# Patient Record
Sex: Female | Born: 2000 | Race: Black or African American | Hispanic: No | Marital: Single | Smoking: Never smoker
Health system: Southern US, Community
[De-identification: ages and names within clinical notes are randomized; demographics above are authoritative.]

## PROBLEM LIST (undated history)

## (undated) DIAGNOSIS — Q72899 Other reduction defects of unspecified lower limb: Secondary | ICD-10-CM

## (undated) DIAGNOSIS — J45909 Unspecified asthma, uncomplicated: Secondary | ICD-10-CM

## (undated) DIAGNOSIS — T8859XA Other complications of anesthesia, initial encounter: Secondary | ICD-10-CM

## (undated) DIAGNOSIS — T4145XA Adverse effect of unspecified anesthetic, initial encounter: Secondary | ICD-10-CM

---
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
## 2019-12-08 ENCOUNTER — Emergency Department (HOSPITAL_BASED_OUTPATIENT_CLINIC_OR_DEPARTMENT_OTHER): Payer: Worker's Compensation

## 2019-12-08 ENCOUNTER — Emergency Department (HOSPITAL_BASED_OUTPATIENT_CLINIC_OR_DEPARTMENT_OTHER)
Admission: EM | Admit: 2019-12-08 | Discharge: 2019-12-08 | Disposition: A | Discharge status: Home / Self Care | Payer: Self-pay | Attending: Emergency Medicine | Admitting: Emergency Medicine

## 2019-12-08 ENCOUNTER — Encounter (HOSPITAL_BASED_OUTPATIENT_CLINIC_OR_DEPARTMENT_OTHER): Payer: Self-pay | Admitting: *Deleted

## 2019-12-08 ENCOUNTER — Other Ambulatory Visit: Payer: Self-pay

## 2019-12-08 DIAGNOSIS — J45909 Unspecified asthma, uncomplicated: Secondary | ICD-10-CM | POA: Insufficient documentation

## 2019-12-08 DIAGNOSIS — S63501A Unspecified sprain of right wrist, initial encounter: Secondary | ICD-10-CM | POA: Insufficient documentation

## 2019-12-08 DIAGNOSIS — X58XXXA Exposure to other specified factors, initial encounter: Secondary | ICD-10-CM | POA: Insufficient documentation

## 2019-12-08 DIAGNOSIS — Y939 Activity, unspecified: Secondary | ICD-10-CM | POA: Insufficient documentation

## 2019-12-08 DIAGNOSIS — Y999 Unspecified external cause status: Secondary | ICD-10-CM | POA: Insufficient documentation

## 2019-12-08 DIAGNOSIS — M25531 Pain in right wrist: Secondary | ICD-10-CM

## 2019-12-08 DIAGNOSIS — Y929 Unspecified place or not applicable: Secondary | ICD-10-CM | POA: Insufficient documentation

## 2019-12-08 DIAGNOSIS — Z7951 Long term (current) use of inhaled steroids: Secondary | ICD-10-CM | POA: Insufficient documentation

## 2019-12-08 NOTE — Discharge Instructions (Signed)
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  Fortunately, there is no evidence of fracture or dislocation.  No evidence of infection on exam.  I suspect this is more of an overuse irritation and inflammation.  Please use the wrist brace, ice, elevation, and follow-up with sports medicine for further management.  Please rest and stay hydrated.  If any symptoms change or worsen acutely, please return to the nearest emergency department.

## 2019-12-08 NOTE — ED Provider Notes (Signed)
No evidence of [facility] EMERGENCY DEPARTMENT Provider Note   CSN: [id number] Arrival date & time: 12/08/19  1901     History Chief Complaint  Patient presents with  . Wrist Pain    Gina Cain is a 19 y.o. female.  The history is provided by the patient and medical records. No language interpreter was used.  Wrist Pain The current episode started more than 2 days ago. The problem occurs constantly. The problem has not changed since onset.Pertinent negatives include no chest pain, no abdominal pain, no headaches and no shortness of breath. Nothing aggravates the symptoms. Nothing relieves the symptoms. She has tried nothing for the symptoms. The treatment provided no relief.       Past Medical History:  Diagnosis Date  . Asthma   . 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 Complication of anesthesia    states woke up during surgery 12/2012    Patient Active Problem List   Diagnosis Date Noted  . Acute pain of left knee 09/29/2017    Past Surgical History:  Procedure Laterality Date  . GRAFT APPLICATION Left 11/18/2015   Procedure: PLACEMENT OF BONE GRAFT AND K WIRES FOURTH METATARSAL;  Surgeon: [name], [name];  Location: [facility];  Service: Podiatry;  Laterality: Left;  . HARDWARE REMOVAL Left 11/18/2015   Procedure: REMOVAL OF FIXATION DEEP K WIRE/SCREW FOURTH METATARSAL LEFT FOOT;  Surgeon: [name], [name];  Location: [facility];  Service: Podiatry;  Laterality: Left;  . METATARSAL OSTEOTOMY Right 01/02/2013   Procedure: FOURTH RIGHT METATARSAL OSTEOTOMY/APPLICATION OF EXTERNAL FIXATOR/SINGLE PLAIN WITH PINS;  Surgeon: Alvan Dame, [name];  Location: [facility];  Service: Podiatry;  Laterality: Right;  . METATARSAL OSTEOTOMY Left 08/12/2015   Procedure: METATARSAL OSTEOTOMY WITH EXTERNAL FIXATOR 4TH LEFT TOE;  Surgeon: [name], [name];  Location: [facility];  Service: Podiatry;   Laterality: Left;     OB History   No obstetric history on file.     Family History  Problem Relation Age of Onset  . Diabetes Mother   . Asthma Brother   . Hypertension Maternal Grandmother   . Hypertension Maternal Grandfather     Social History   Tobacco Use  . Smoking status: Never Smoker  . Smokeless tobacco: Never Used  Vaping Use  . Vaping Use: Never used  Substance Use Topics  . Alcohol use: No  . Drug use: No    Home Medications Prior to Admission medications   Medication Sig Start Date End Date Taking? Authorizing Provider  ALBUTEROL SULFATE IN Inhale into the lungs.    [provider]  naproxen (NAPROSYN) 500 MG tablet Take 1 tablet (500 mg total) by mouth 2 (two) times daily. 11/30/18   Petrucelli, Samantha R, [name]    Allergies    Sulfa antibiotics  Review of Systems   Review of Systems  Constitutional: Negative for chills, diaphoresis, fatigue and fever.  HENT: Negative for congestion.   Respiratory: Negative for cough, chest tightness, shortness of breath and wheezing.   Cardiovascular: Negative for chest pain and palpitations.  Gastrointestinal: Negative for abdominal pain, constipation, diarrhea, nausea and vomiting.  Genitourinary: Negative for flank pain.  Musculoskeletal: Negative for back pain and neck pain.  Skin: Negative for rash and wound.  Neurological: Negative for light-headedness and headaches.  Psychiatric/Behavioral: Negative for agitation.  All other systems reviewed and are negative.   Physical Exam Updated Vital Signs BP 120/77 (BP Location:  Right Arm)   Pulse 61   Temp 98.9 F (37.2 C) (Oral)   Resp 18   Ht 5\' 5"  (1.651 m)   Wt 74.8 kg   LMP 11/07/2019   SpO2 100%   BMI 27.46 kg/m   Physical Exam Vitals and nursing note reviewed.  Constitutional:      General: She is not in acute distress.    Appearance: She is well-developed. She is not ill-appearing, toxic-appearing or diaphoretic.  HENT:     Head:  Normocephalic and atraumatic.     Nose: No congestion or rhinorrhea.     Mouth/Throat:     Mouth: Mucous membranes are moist.  Eyes:     Extraocular Movements: Extraocular movements intact.     Conjunctiva/sclera: Conjunctivae normal.     Pupils: Pupils are equal, round, and reactive to light.  Cardiovascular:     Rate and Rhythm: Normal rate and regular rhythm.     Pulses: Normal pulses.     Heart sounds: No murmur heard.   Pulmonary:     Effort: Pulmonary effort is normal. No respiratory distress.     Breath sounds: Normal breath sounds. No wheezing, rhonchi or rales.  Chest:     Chest wall: No tenderness.  Abdominal:     General: Abdomen is flat.     Palpations: Abdomen is soft.     Tenderness: There is no abdominal tenderness. There is no right CVA tenderness, left CVA tenderness, guarding or rebound.  Musculoskeletal:        General: Tenderness present.     Cervical back: Neck supple.     Right lower leg: No edema.     Left lower leg: No edema.     Comments: Normal pulse, grip strength, sensation in right hand.  Good range of motion.  Normal pulses.  No laceration.  Tenderness present in wrist.  No snuffbox tenderness.  Exam otherwise unremarkable.  Skin:    General: Skin is warm and dry.     Capillary Refill: Capillary refill takes less than 2 seconds.     Coloration: Skin is not pale.     Findings: No erythema, lesion or rash.  Neurological:     General: No focal deficit present.     Mental Status: She is alert.  Psychiatric:        Mood and Affect: Mood normal.     ED Results / Procedures / Treatments   Labs (all labs ordered are listed, but only abnormal results are displayed) Labs Reviewed - No data to display  EKG None  Radiology DG Wrist Complete Right  Result Date: 12/08/2019 CLINICAL DATA:  19 year old female with right wrist pain. EXAM: RIGHT WRIST - COMPLETE 3+ VIEW COMPARISON:  None. FINDINGS: There is no evidence of fracture or dislocation. There  is no evidence of arthropathy or other focal bone abnormality. Soft tissues are unremarkable. IMPRESSION: Negative. Electronically Signed   By: Anner Crete M.D.   On: 12/08/2019 19:59    Procedures Procedures (including critical care time)  Medications Ordered in ED Medications - No data to display  ED Course  I have reviewed the triage vital signs and the nursing notes.  Pertinent labs & imaging results that were available during my care of the patient were reviewed by me and considered in my medical decision making (see chart for details).    MDM Rules/Calculators/A&P  Gina Cain is a 19 y.o. female with no significant past medical history presents with right wrist pain.  Patient reports that she had wrist pain on and off for the last few months and seems to be worsened when she is using a drill at work.  She reports she sometimes gets it twisted and it hurts initially after this maneuver.  She says that the pain is moderate.  She is ambidextrous and uses both of her hands.  She reports no constant numbness or tingling but reports occasional tingling when the pain is severe.  She denies history of carpal tunnel or wrist injuries in the past.  No prior fractures.  She denies any elbow or shoulder pains.  No other complaint specifically no chest pain, shortness breath, nausea, vomiting, urinary symptoms or GI symptoms.  No fevers or chills.  No skin changes or redness.  She denies any crush injuries.  On exam, patient has some tenderness in her right wrist but has normal wrist extension and flexion.  Normal grip strength and sensation.  No laceration.  No crepitance.  Erythema.  No evidence of infection.  No tenderness in the elbow or shoulder.  Lungs clear and chest nontender.  Abdomen nontender.  Good pulses.  Patient had x-ray showing no fracture dislocation.  Suspect overuse causing wrist sprain and wrist pain.  Given lack of trauma, low suspicion for  occult fracture or scaphoid injury.  Low suspicion for infection based on exam and history.  Was given a wrist brace and will follow up with sports medicine for further management.  She will use over-the-counter anti-inflammatory medication.  She has no other questions or concerns and was discharged in good condition.    Final Clinical Impression(s) / ED Diagnoses Final diagnoses:  Sprain of right wrist, initial encounter  Right wrist pain    Rx / DC Orders ED Discharge Orders    None     Clinical Impression: 1. Sprain of right wrist, initial encounter   2. Right wrist pain     Disposition: Discharge  Condition: Good  I have discussed the results, Dx and Tx plan with the pt(& family if present). He/she/they expressed understanding and agree(s) with the plan. Discharge instructions discussed at great length. Strict return precautions discussed and pt &/or family have verbalized understanding of the instructions. No further questions at time of discharge.    Discharge Medication List as of 12/08/2019  8:44 PM      Follow Up: Myra Rude, MD [address] [place] [address] [phone]   with sports medicine  [facility] [address] [id number] [place] [address] [phone]       Zanayah Shadowens, Canary Brim, MD 12/09/19 0110

## 2019-12-08 NOTE — ED Triage Notes (Signed)
Right wrist pain for a week. 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

## 2020-08-05 ENCOUNTER — Emergency Department (HOSPITAL_BASED_OUTPATIENT_CLINIC_OR_DEPARTMENT_OTHER): Payer: Worker's Compensation

## 2020-08-05 ENCOUNTER — Emergency Department (HOSPITAL_BASED_OUTPATIENT_CLINIC_OR_DEPARTMENT_OTHER)
Admission: EM | Admit: 2020-08-05 | Discharge: 2020-08-05 | Disposition: A | Discharge status: Home / Self Care | Payer: Worker's Compensation | Attending: Emergency Medicine | Admitting: Emergency Medicine

## 2020-08-05 ENCOUNTER — Other Ambulatory Visit: Payer: Self-pay

## 2020-08-05 ENCOUNTER — Encounter (HOSPITAL_BASED_OUTPATIENT_CLINIC_OR_DEPARTMENT_OTHER): Payer: Self-pay

## 2020-08-05 DIAGNOSIS — M25531 Pain in right wrist: Secondary | ICD-10-CM | POA: Diagnosis present

## 2020-08-05 DIAGNOSIS — J45909 Unspecified asthma, uncomplicated: Secondary | ICD-10-CM | POA: Insufficient documentation

## 2020-08-05 NOTE — ED Triage Notes (Signed)
Pt states she was lifting "bread" at work when her right wrist popped. No swelling noted, states hurts with movement.

## 2020-08-05 NOTE — Discharge Instructions (Addendum)
Your work-up today was reassuring.  Please read the attachment on tendinitis.  Your symptoms may be reflective of overuse injury given your job.  X-rays were obtained and without any obvious explanation or injury.  No fractures or dislocation.  I would like you to continue with rest, ice, elevation, and wearing a brace for support.  Please avoid overuse to see if your symptoms improve with rest.  Continue with ibuprofen 600 mg every 6 hours as needed for pain and inflammation.  Please get established with a primary care provider for ongoing evaluation and management.  I have also referred you to Dr. Izora Ribas, hand surgeon, for ongoing evaluation and management should her symptoms fail to improve with conservative treatment.  Return to the ED or seek immediate medical attention should you experience any new or worsening symptoms.

## 2020-08-05 NOTE — ED Provider Notes (Signed)
[facility] EMERGENCY DEPARTMENT Provider Note   CSN: [id number] Arrival date & time: 08/05/20  1859     History Chief Complaint  Patient presents with  . Wrist Injury    Gina Cain is a 20 y.o. female with no relevant past medical history presents the ED with complaints of right wrist discomfort.  On my examination, patient reports that she works for [employer] in [employer] in [place], [place].  She was lifting a pallet of bread above her head when she suddenly developed some discomfort in the area of her right wrist.  She states that it was generalized, not focal.  She also had mild associated tingling numbness sensation in her hand.  She has been taking ibuprofen and icing it, with little relief.  This injury occurred yesterday and today she felt compelled to come to the ED given lack of improvement.  She is in the process of getting established with a primary care provider.  She has been keeping it in a splint which she has at home.  She is accompanied by her mom was at bedside.  No other injuries or complaints.   HPI     Past Medical History:  Diagnosis Date  . Asthma   . 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 Complication of anesthesia    states woke up during surgery 12/2012    Patient Active Problem List   Diagnosis Date Noted  . Acute pain of left knee 09/29/2017    Past Surgical History:  Procedure Laterality Date  . GRAFT APPLICATION Left 11/18/2015   Procedure: PLACEMENT OF BONE GRAFT AND K WIRES FOURTH METATARSAL;  Surgeon: [name], [name];  Location: [facility];  Service: Podiatry;  Laterality: Left;  . HARDWARE REMOVAL Left 11/18/2015   Procedure: REMOVAL OF FIXATION DEEP K WIRE/SCREW FOURTH METATARSAL LEFT FOOT;  Surgeon: [name], [name];  Location: [facility];  Service: Podiatry;  Laterality: Left;  . METATARSAL OSTEOTOMY Right 01/02/2013   Procedure: FOURTH RIGHT METATARSAL OSTEOTOMY/APPLICATION OF EXTERNAL  FIXATOR/SINGLE PLAIN WITH PINS;  Surgeon: Alvan Dame, [name];  Location: [facility];  Service: Podiatry;  Laterality: Right;  . METATARSAL OSTEOTOMY Left 08/12/2015   Procedure: METATARSAL OSTEOTOMY WITH EXTERNAL FIXATOR 4TH LEFT TOE;  Surgeon: [name], [name];  Location: [facility];  Service: Podiatry;  Laterality: Left;     OB History   No obstetric history on file.     Family History  Problem Relation Age of Onset  . Diabetes Mother   . Asthma Brother   . Hypertension Maternal Grandmother   . Hypertension Maternal Grandfather     Social History   Tobacco Use  . Smoking status: Never Smoker  . Smokeless tobacco: Never Used  Vaping Use  . Vaping Use: Never used  Substance Use Topics  . Alcohol use: No  . Drug use: No    Home Medications Prior to Admission medications   Medication Sig Start Date End Date Taking? Authorizing Provider  ALBUTEROL SULFATE IN Inhale into the lungs.    [provider]  naproxen (NAPROSYN) 500 MG tablet Take 1 tablet (500 mg total) by mouth 2 (two) times daily. 11/30/18   Petrucelli, Samantha R, [name]    Allergies    Sulfa antibiotics  Review of Systems   Review of Systems  All other systems reviewed and are negative.   Physical Exam Updated Vital Signs BP 121/81 (BP Location: Right Arm)   Pulse  63   Temp 98.5 F (36.9 C) (Oral)   Resp 18   Ht 5\' 5"  (1.651 m)   Wt 70.3 kg   LMP 07/11/2020   SpO2 100%   BMI 25.79 kg/m   Physical Exam Vitals and nursing note reviewed. Exam conducted with a chaperone present.  HENT:     Head: Normocephalic and atraumatic.  Eyes:     General: No scleral icterus.    Conjunctiva/sclera: Conjunctivae normal.  Cardiovascular:     Rate and Rhythm: Normal rate.     Pulses: Normal pulses.  Pulmonary:     Effort: Pulmonary effort is normal. No respiratory distress.  Musculoskeletal:        General: Normal range of motion.     Comments: Right wrist: Flexion  extension intact.  Radial pulse intact and symmetric with contralateral wrist.  Capillary refill less than 2 seconds.  Pinky to thumb exercise intact.  No anatomic snuffbox tenderness.  Radial, median, and ulnar nerves all assessed and intact.  Negative Phalen's and Tinel's sign.  No swelling or erythema.  Skin:    General: Skin is dry.     Capillary Refill: Capillary refill takes less than 2 seconds.  Neurological:     Mental Status: She is alert and oriented to person, place, and time.     GCS: GCS eye subscore is 4. GCS verbal subscore is 5. GCS motor subscore is 6.     Sensory: No sensory deficit.  Psychiatric:        Mood and Affect: Mood normal.        Behavior: Behavior normal.        Thought Content: Thought content normal.     ED Results / Procedures / Treatments   Labs (all labs ordered are listed, but only abnormal results are displayed) Labs Reviewed - No data to display  EKG None  Radiology DG Wrist Complete Right  Result Date: 08/05/2020 CLINICAL DATA:  Wrist pain EXAM: RIGHT WRIST - COMPLETE 3+ VIEW COMPARISON:  12/08/2019 FINDINGS: There is no evidence of fracture or dislocation. There is no evidence of arthropathy or other focal bone abnormality. Soft tissues are unremarkable. IMPRESSION: Negative. Electronically Signed   By: 12/10/2019 M.D.   On: 08/05/2020 20:01    Procedures Procedures   Medications Ordered in ED Medications - No data to display  ED Course  I have reviewed the triage vital signs and the nursing notes.  Pertinent labs & imaging results that were available during my care of the patient were reviewed by me and considered in my medical decision making (see chart for details).    MDM Rules/Calculators/A&P                          Gina Cain was evaluated in Emergency Department on 08/05/2020 for the symptoms described in the history of present illness. She was evaluated in the context of the global COVID-19 pandemic, which  necessitated consideration that the patient might be at risk for infection with the SARS-CoV-2 virus that causes COVID-19. Institutional protocols and algorithms that pertain to the evaluation of patients at risk for COVID-19 are in a state of rapid change based on information released by regulatory bodies including the CDC and federal and state organizations. These policies and algorithms were followed during the patient's care in the ED.  I personally reviewed patient's medical chart and all notes from triage and staff during today's encounter. I have also  ordered and reviewed all labs and imaging that I felt to be medically necessary in the evaluation of this patient's complaints and with consideration of their physical exam. If needed, translation services were available and utilized.   Patient's history and physical exam is benign.  She already has a brace at home.  She is taking ibuprofen regularly.  Her sensation is grossly intact.  She is functionally neurovascular intact.  Negative Phalen's and Tinel's sign, lower suspicion for median nerve compression.  X-rays are personally reviewed and demonstrate no acute osseous findings.  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  She states that she is already encouraged to stay home and rest and recuperate.  Encourage her to follow-up with primary care provider for ongoing evaluation management.  No swelling, erythema, or symptoms of systemic illness.  Nontender on my exam.  Low suspicion for inflammatory or infectious joint.  Given that ligamentous or tendinous injury cannot be excluded despite very reassuring exams here in the ED, will refer to on-call hand surgeon Dr. Izora Ribas should she fail to improve with conservative management.  ED return precautions discussed.  Patient and mother voiced understanding and are agreeable to the plan.   Final Clinical Impression(s) / ED Diagnoses Final diagnoses:  Right wrist pain     Rx / DC Orders ED Discharge Orders    None       Gina Cain 08/05/20 2107    Milagros Loll, MD 08/06/20 [phone]

## 2020-08-30 ENCOUNTER — Emergency Department (HOSPITAL_BASED_OUTPATIENT_CLINIC_OR_DEPARTMENT_OTHER)
Admission: EM | Admit: 2020-08-30 | Discharge: 2020-08-30 | Disposition: A | Discharge status: Home / Self Care | Payer: Self-pay | Attending: Emergency Medicine | Admitting: Emergency Medicine

## 2020-08-30 ENCOUNTER — Other Ambulatory Visit: Payer: Self-pay

## 2020-08-30 ENCOUNTER — Encounter (HOSPITAL_BASED_OUTPATIENT_CLINIC_OR_DEPARTMENT_OTHER): Payer: Self-pay | Admitting: *Deleted

## 2020-08-30 ENCOUNTER — Emergency Department (HOSPITAL_BASED_OUTPATIENT_CLINIC_OR_DEPARTMENT_OTHER): Payer: Self-pay

## 2020-08-30 DIAGNOSIS — R0602 Shortness of breath: Secondary | ICD-10-CM | POA: Insufficient documentation

## 2020-08-30 DIAGNOSIS — R0789 Other chest pain: Secondary | ICD-10-CM | POA: Insufficient documentation

## 2020-08-30 DIAGNOSIS — J45909 Unspecified asthma, uncomplicated: Secondary | ICD-10-CM | POA: Insufficient documentation

## 2020-08-30 LAB — COMPREHENSIVE METABOLIC PANEL
ALT: 23 U/L (ref 0–44)
AST: 22 U/L (ref 15–41)
Albumin: 4.2 g/dL (ref 3.5–5.0)
Alkaline Phosphatase: 33 U/L — ABNORMAL LOW (ref 38–126)
Anion gap: 8 (ref 5–15)
BUN: 8 mg/dL (ref 6–20)
CO2: 26 mmol/L (ref 22–32)
Calcium: 9.1 mg/dL (ref 8.9–10.3)
Chloride: 103 mmol/L (ref 98–111)
Creatinine, Ser: 0.71 mg/dL (ref 0.44–1.00)
GFR, Estimated: >60 mL/min (ref >60)
Glucose, Bld: 109 mg/dL — ABNORMAL HIGH (ref 70–99)
Potassium: 3.8 mmol/L (ref 3.5–5.1)
Sodium: 137 mmol/L (ref 135–145)
Total Bilirubin: 0.3 mg/dL (ref 0.3–1.2)
Total Protein: 6.9 g/dL (ref 6.5–8.1)

## 2020-08-30 LAB — CBC WITH DIFFERENTIAL/PLATELET
Abs Immature Granulocytes: 0.03 10*3/uL (ref 0.00–0.07)
Basophils Absolute: 0 10*3/uL (ref 0.0–0.1)
Basophils Relative: 1 %
Eosinophils Absolute: 0.7 10*3/uL — ABNORMAL HIGH (ref 0.0–0.5)
Eosinophils Relative: 11 %
HCT: 41.1 % (ref 36.0–46.0)
Hemoglobin: 13.5 g/dL (ref 12.0–15.0)
Immature Granulocytes: 0 %
Lymphocytes Relative: 28 %
Lymphs Abs: 1.9 10*3/uL (ref 0.7–4.0)
MCH: 29.4 pg (ref 26.0–34.0)
MCHC: 32.8 g/dL (ref 30.0–36.0)
MCV: 89.5 fL (ref 80.0–100.0)
Monocytes Absolute: 0.5 10*3/uL (ref 0.1–1.0)
Monocytes Relative: 7 %
Neutro Abs: 3.6 10*3/uL (ref 1.7–7.7)
Neutrophils Relative %: 53 %
Platelets: 221 10*3/uL (ref 150–400)
RBC: 4.59 MIL/uL (ref 3.87–5.11)
RDW: 12.2 % (ref 11.5–15.5)
WBC: 6.8 10*3/uL (ref 4.0–10.5)
nRBC: 0 % (ref 0.0–0.2)

## 2020-08-30 LAB — PREGNANCY, URINE: Preg Test, Ur: NEGATIVE

## 2020-08-30 LAB — RAPID URINE DRUG SCREEN, HOSP PERFORMED
Amphetamines: NOT DETECTED
Barbiturates: NOT DETECTED
Benzodiazepines: NOT DETECTED
Cocaine: NOT DETECTED
Opiates: NOT DETECTED
Tetrahydrocannabinol: NOT DETECTED

## 2020-08-30 LAB — TROPONIN I (HIGH SENSITIVITY): Troponin I (High Sensitivity): <2 ng/L (ref <18)

## 2020-08-30 NOTE — ED Triage Notes (Signed)
Chest pain off and on x 3 days. Today she felt SOB.  Hx of same with no cardiac problems.

## 2020-08-30 NOTE — Discharge Instructions (Addendum)
Your laboratory results today were within normal limits.  You may alternate Tylenol or ibuprofen to help with your pain.  You may return to the emergency department if your pain worsens.

## 2020-08-30 NOTE — ED Provider Notes (Signed)
[facility] EMERGENCY DEPARTMENT Provider Note   CSN: [id number] Arrival date & time: 08/30/20  1443     History Chief Complaint  Patient presents with  . Chest Pain    Gina Cain is a 20 y.o. female.  20 y.o female with no past medical history presents to the ED with a chief complaint of chest pain for the past 2 days. Patient reports pain began while she was eating. Exacerbated with movement and laying flat, and relieve with rest. Has tried acetaminophen without improvement.  She also reports she is currently donating plasma, has been for the past 2 weeks.  There is no shortness of breath with the chest pain.  Currently not on any birth control, no smoking history, no prior history of CAD.  No family history of heart disease.  The history is provided by the patient.  Chest Pain Pain location:  L chest Pain quality: pressure and stabbing   Pain radiates to:  Does not radiate Pain severity:  Mild Onset quality:  Sudden Duration:  2 days Timing:  Intermittent Progression:  Worsening Chronicity:  New Context: not drug use, not lifting, not movement and not stress   Relieved by:  Nothing Worsened by:  Deep breathing Ineffective treatments:  None tried Associated symptoms: shortness of breath   Associated symptoms: no back pain, no cough, no fever, no headache, no nausea, no syncope, no vomiting and no weakness   Risk factors: no birth control, no coronary artery disease, no diabetes mellitus, no high cholesterol, no hypertension, not female and no smoking        Past Medical History:  Diagnosis Date  . Asthma   . 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 Complication of anesthesia    states woke up during surgery 12/2012    Patient Active Problem List   Diagnosis Date Noted  . Acute pain of left knee 09/29/2017    Past Surgical History:  Procedure Laterality Date  . GRAFT APPLICATION Left 11/18/2015   Procedure: PLACEMENT OF BONE GRAFT AND K WIRES FOURTH  METATARSAL;  Surgeon: [name], [name];  Location: [facility];  Service: Podiatry;  Laterality: Left;  . HARDWARE REMOVAL Left 11/18/2015   Procedure: REMOVAL OF FIXATION DEEP K WIRE/SCREW FOURTH METATARSAL LEFT FOOT;  Surgeon: [name], [name];  Location: [facility];  Service: Podiatry;  Laterality: Left;  . METATARSAL OSTEOTOMY Right 01/02/2013   Procedure: FOURTH RIGHT METATARSAL OSTEOTOMY/APPLICATION OF EXTERNAL FIXATOR/SINGLE PLAIN WITH PINS;  Surgeon: Alvan Dame, [name];  Location: [facility];  Service: Podiatry;  Laterality: Right;  . METATARSAL OSTEOTOMY Left 08/12/2015   Procedure: METATARSAL OSTEOTOMY WITH EXTERNAL FIXATOR 4TH LEFT TOE;  Surgeon: [name], [name];  [facility];  Service: Podiatry;  Laterality: Left;     OB History   No obstetric history on file.     Family History  Problem Relation Age of Onset  . Diabetes Mother   . Asthma Brother   . Hypertension Maternal Grandmother   . Hypertension Maternal Grandfather     Social History   Tobacco Use  . Smoking status: Never Smoker  . Smokeless tobacco: Never Used  Vaping Use  . Vaping Use: Never used  Substance Use Topics  . Alcohol use: No  . Drug use: No    Home Medications Prior to Admission medications   Medication Sig Start Date End Date Taking? Authorizing Provider  ALBUTEROL SULFATE IN Inhale into the  lungs.    [provider]  naproxen (NAPROSYN) 500 MG tablet Take 1 tablet (500 mg total) by mouth 2 (two) times daily. 11/30/18   Petrucelli, Samantha R, [name]    Allergies    Sulfa antibiotics  Review of Systems   Review of Systems  Constitutional: Negative for fever.  HENT: Negative for sore throat.   Respiratory: Positive for shortness of breath. Negative for cough.   Cardiovascular: Positive for chest pain. Negative for syncope.  Gastrointestinal: Negative for nausea and vomiting.  Genitourinary: Negative for  flank pain.  Musculoskeletal: Negative for back pain.  Neurological: Negative for weakness and headaches.  All other systems reviewed and are negative.   Physical Exam Updated Vital Signs BP 114/77   Pulse 82   Temp 98.3 F (36.8 C) (Oral)   Resp 18   Ht 5\' 5"  (1.651 m)   Wt 71.7 kg   SpO2 100%   BMI 26.31 kg/m   Physical Exam Vitals and nursing note reviewed.  Constitutional:      Appearance: She is well-developed.  HENT:     Head: Normocephalic and atraumatic.  Cardiovascular:     Rate and Rhythm: Normal rate.  Pulmonary:     Effort: Pulmonary effort is normal.     Breath sounds: No decreased breath sounds, wheezing or rhonchi.  Musculoskeletal:     Right lower leg: No tenderness. No edema.     Left lower leg: No tenderness. No edema.  Skin:    General: Skin is warm and dry.  Neurological:     Mental Status: She is alert and oriented to person, place, and time.     ED Results / Procedures / Treatments   Labs (all labs ordered are listed, but only abnormal results are displayed) Labs Reviewed  CBC WITH DIFFERENTIAL/PLATELET - Abnormal; Notable for the following components:      Result Value   Eosinophils Absolute 0.7 (*)    All other components within normal limits  COMPREHENSIVE METABOLIC PANEL - Abnormal; Notable for the following components:   Glucose, Bld 109 (*)    Alkaline Phosphatase 33 (*)    All other components within normal limits  PREGNANCY, URINE  RAPID URINE DRUG SCREEN, HOSP PERFORMED  TROPONIN I (HIGH SENSITIVITY)  TROPONIN I (HIGH SENSITIVITY)    EKG EKG Interpretation  Date/Time:  Friday August 30 2020 15:03:42 EDT Ventricular Rate:  78 PR Interval:    QRS Duration: 81 QT Interval:  403 QTC Calculation: 459 R Axis:   66 Text Interpretation: Sinus rhythm No significant change since last tracing Confirmed by 12[birth year]27[birth year]1993 [id number]) on 08/30/2020 3:23:29 PM   Radiology DG Chest 2 View  Result Date: 08/30/2020 CLINICAL DATA:  2-3  days of chest pain with eating. EXAM: CHEST - 2 VIEW COMPARISON:  Nov 02, 2018 FINDINGS: The heart size and mediastinal contours are within normal limits. Both lungs are clear. The visualized skeletal structures are unremarkable. IMPRESSION: No active cardiopulmonary disease. Electronically Signed   By: Nov 04, 2018 III M.D   On: 08/30/2020 16:04    Procedures Procedures   Medications Ordered in ED Medications - No data to display  ED Course  I have reviewed the triage vital signs and the nursing notes.  Pertinent labs & imaging results that were available during my care of the patient were reviewed by me and considered in my medical decision making (see chart for details).  Clinical Course as of 08/30/20 1757  Fri Aug 30, 2020  1649  Troponin I (High Sensitivity): <2 [JS]    Clinical Course User Index [JS] Claude Manges, [name]   MDM Rules/Calculators/A&P    Patient with no pertinent past medical history presents to the ED with a chief complaint of chest pain for the past 2 days along with shortness of breath, exacerbated with deep inspiration.  Reports she is taken Tylenol without improvement in her symptoms.  She is currently donating plasma, has been for the past 2 weeks.  No infectious signs such as fever, cough, nasal congestion.  During evaluation and arrival in the ED patient's vitals are within normal limits, she is afebrile without any tachypnea or tachycardia.  No prior history of CAD, no family history of CAD or pulmonary embolism.  Currently not on any birth control.  PERC Negative.   Interpretation of her labs by me revealed a CBC without any leukocytosis, hemoglobin stable.  CMP without any electrolyte normality, creatinine function is within normal limits.  LFTs are unremarkable.  X-ray without any acute process such as pneumonia, or pneumothorax.  Troponin is negative, pending delta although suspicion for ACS as pain has been ongoing for the past 2 days and has been  recurrent in nature.  She does not have any prior risk factors of this.  HEART score 1  UDS without any detectable substance.  Urine pregnancy is negative.  Patient understands and agrees to management, results discussed at length with her.  Patient stable for discharge.   Portions of this note were generated with Scientist, clinical (histocompatibility and immunogenetics). Dictation errors may occur despite best attempts at proofreading.  Final Clinical Impression(s) / ED Diagnoses Final diagnoses:  Atypical chest pain    Rx / DC Orders ED Discharge Orders    None       Claude Manges, [name] 08/30/20 1758    Tegeler, Canary Brim, MD 08/31/20 0005

## 2020-12-11 ENCOUNTER — Other Ambulatory Visit: Payer: Self-pay

## 2020-12-11 ENCOUNTER — Emergency Department (HOSPITAL_BASED_OUTPATIENT_CLINIC_OR_DEPARTMENT_OTHER)
Admission: EM | Admit: 2020-12-11 | Discharge: 2020-12-11 | Disposition: A | Discharge status: Home / Self Care | Payer: BLUE CROSS/BLUE SHIELD | Attending: Emergency Medicine | Admitting: Emergency Medicine

## 2020-12-11 ENCOUNTER — Encounter (HOSPITAL_BASED_OUTPATIENT_CLINIC_OR_DEPARTMENT_OTHER): Payer: Self-pay | Admitting: Emergency Medicine

## 2020-12-11 DIAGNOSIS — J45909 Unspecified asthma, uncomplicated: Secondary | ICD-10-CM | POA: Insufficient documentation

## 2020-12-11 DIAGNOSIS — M545 Low back pain, unspecified: Secondary | ICD-10-CM | POA: Diagnosis present

## 2020-12-11 DIAGNOSIS — Y9302 Activity, running: Secondary | ICD-10-CM | POA: Insufficient documentation

## 2020-12-11 DIAGNOSIS — W1839XA Other fall on same level, initial encounter: Secondary | ICD-10-CM | POA: Insufficient documentation

## 2020-12-11 MED ORDER — IBUPROFEN 800 MG PO TABS: 800.0000 mg | ORAL_TABLET | Freq: Three times a day (TID) | ORAL | 0 refills | Status: AC | PRN

## 2020-12-11 MED ORDER — METHOCARBAMOL 500 MG PO TABS: 500.0000 mg | ORAL_TABLET | Freq: Two times a day (BID) | ORAL | 0 refills | Status: AC | PRN

## 2020-12-11 NOTE — ED Provider Notes (Signed)
[facility] EMERGENCY DEPT Provider Note   CSN: [id number] Arrival date & time: 12/11/20  1529     History Chief Complaint  Patient presents with   Back Pain    Gina Cain is a 20 y.o. female.  She is complaining of left-sided low back pain that started 3 days ago after she was running and fell.  She has been doing ice and some ibuprofen without any improvement.  Worse with different positions.  No numbness or weakness.  No abdominal pain or urinary symptoms.  The history is provided by the patient.  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    Complication of anesthesia    states woke up during surgery 12/2012    Patient Active Problem List   Diagnosis Date Noted   Acute pain of left knee 09/29/2017    Past Surgical History:  Procedure Laterality Date   GRAFT APPLICATION Left 11/18/2015   Procedure: PLACEMENT OF BONE GRAFT AND K WIRES FOURTH METATARSAL;  Surgeon: [name], [name];  Location: [facility];  Service: Podiatry;  Laterality: Left;   HARDWARE REMOVAL Left 11/18/2015   Procedure: REMOVAL OF FIXATION DEEP K WIRE/SCREW FOURTH METATARSAL LEFT FOOT;  Surgeon: [name], [name];  Location: [facility];  Service: Podiatry;  Laterality: Left;   METATARSAL OSTEOTOMY Right 01/02/2013   Procedure: FOURTH RIGHT METATARSAL OSTEOTOMY/APPLICATION  OF EXTERNAL FIXATOR/SINGLE PLAIN WITH PINS;  Surgeon: Alvan Dame, [name];  Location: [facility];  Service: Podiatry;  Laterality: Right;   METATARSAL OSTEOTOMY Left 08/12/2015   Procedure: METATARSAL OSTEOTOMY WITH EXTERNAL FIXATOR 4TH LEFT TOE;  Surgeon: [name], [name];  Location: [facility];  Service: Podiatry;  Laterality: Left;     OB History   No obstetric history on file.     Family History  Problem Relation Age of Onset   Diabetes Mother    Asthma Brother    Hypertension Maternal Grandmother    Hypertension Maternal Grandfather     Social History   Tobacco Use   Smoking status: Never   Smokeless tobacco: Never  Vaping Use   Vaping Use: Never used  Substance Use Topics   Alcohol use: No   Drug use: No    Home Medications Prior to Admission medications   Medication Sig Start Date End Date Taking? Authorizing Provider  ALBUTEROL SULFATE IN Inhale into the lungs.    [provider]  naproxen (NAPROSYN) 500 MG tablet Take 1 tablet (500 mg total) by mouth 2 (two) times daily. 11/30/18   Petrucelli, Samantha R, [name]    Allergies    Sulfa antibiotics  Review of Systems   Review of Systems  Constitutional:  Negative for fever.  Cardiovascular:  Negative for chest pain.  Gastrointestinal:  Negative for abdominal pain and bowel incontinence.  Genitourinary:  Negative for bladder incontinence and dysuria.  Musculoskeletal:  Positive for back pain.  Neurological:  Negative for weakness and numbness.   Physical Exam Updated Vital Signs BP 125/73 (BP Location: Right Arm)   Pulse 68   Temp 98.1 F (36.7 C) (Oral)   Resp 18   Ht 5\' 5"  (1.651 m)   Wt 72.6 kg   SpO2 100%   BMI 26.63 kg/m   Physical Exam Vitals and nursing note reviewed.  Constitutional:      General: She is not in acute distress.    Appearance: Normal appearance. She is well-developed.  HENT:     Head: Normocephalic and atraumatic.  Eyes:      Conjunctiva/sclera: Conjunctivae normal.  Cardiovascular:     Rate and Rhythm: Normal rate and regular rhythm.     Heart sounds: No murmur heard. Pulmonary:     Effort: Pulmonary effort is normal. No respiratory distress.     Breath sounds: Normal breath sounds.  Abdominal:     Palpations: Abdomen is soft.     Tenderness: There is no abdominal tenderness.  Musculoskeletal:        General: Tenderness present. Normal range of motion.     Cervical back: Neck supple.     Comments: She has no midline spine tenderness.  She is tender left paralumbar which reproduces her complaint.  Skin:    General: Skin is warm and dry.     Capillary Refill: Capillary refill takes less than 2 seconds.  Neurological:     General: No focal deficit present.     Mental Status: She is alert.     Sensory: No sensory deficit.     Motor: No weakness.     Gait: Gait normal.    ED Results / Procedures / Treatments   Labs (all labs ordered are listed, but only abnormal results are displayed) Labs Reviewed - No data to display  EKG None  Radiology No results found.  Procedures Procedures   Medications Ordered in ED Medications - No data to display  ED Course  I have reviewed the triage vital signs and the nursing notes.  Pertinent labs & imaging results that were available during my care of the patient were reviewed by me and considered in my medical decision making (see chart for details).    MDM Rules/Calculators/A&P                         Healthy 20 year old here with some left-sided back pain after a fall.  Some reproducible paralumbar tenderness.  No urinary symptoms.  Offered x-ray which she declines.  Recommended symptomatic treatment with NSAIDs and muscle relaxant.  Return instructions discussed  Final Clinical Impression(s) / ED Diagnoses Final diagnoses:  Acute left-sided low back pain without sciatica    Rx / DC Orders ED Discharge Orders          Ordered    ibuprofen (ADVIL)  800 MG tablet  Every 8 hours PRN        12/11/20 1554    methocarbamol (ROBAXIN) 500 MG tablet  2 times daily PRN        12/11/20 1554             12/13/20, MD 12/12/20 [phone]
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
## 2020-12-11 NOTE — Discharge Instructions (Addendum)
You were seen in the emergency department for evaluation of left-sided low back pain.  This is likely muscular in nature.  You can continue ice or heat to the affected area.  We are prescribing you some ibuprofen and a muscle relaxant.  Do not drink alcohol or operate heavy machinery while taking this.  Gentle stretching.  Return to the emergency department for any worsening or concerning symptoms

## 2021-03-12 ENCOUNTER — Encounter (HOSPITAL_BASED_OUTPATIENT_CLINIC_OR_DEPARTMENT_OTHER): Payer: Self-pay | Admitting: Emergency Medicine

## 2021-03-12 ENCOUNTER — Other Ambulatory Visit: Payer: Self-pay

## 2021-03-12 ENCOUNTER — Emergency Department (HOSPITAL_BASED_OUTPATIENT_CLINIC_OR_DEPARTMENT_OTHER)
Admission: EM | Admit: 2021-03-12 | Discharge: 2021-03-13 | Disposition: A | Discharge status: Home / Self Care | Payer: BLUE CROSS/BLUE SHIELD | Attending: Emergency Medicine | Admitting: Emergency Medicine

## 2021-03-12 DIAGNOSIS — M25571 Pain in right ankle and joints of right foot: Secondary | ICD-10-CM | POA: Diagnosis not present

## 2021-03-12 DIAGNOSIS — W010XXA Fall on same level from slipping, tripping and stumbling without subsequent striking against object, initial encounter: Secondary | ICD-10-CM | POA: Diagnosis not present

## 2021-03-12 DIAGNOSIS — J45909 Unspecified asthma, uncomplicated: Secondary | ICD-10-CM | POA: Diagnosis not present

## 2021-03-12 DIAGNOSIS — Z5321 Procedure and treatment not carried out due to patient leaving prior to being seen by health care provider: Secondary | ICD-10-CM | POA: Insufficient documentation

## 2021-03-12 DIAGNOSIS — S8991XA Unspecified injury of right lower leg, initial encounter: Secondary | ICD-10-CM | POA: Insufficient documentation

## 2021-03-12 NOTE — ED Triage Notes (Signed)
Reports she thinks she injured her right achilles tendon.  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

## 2021-03-12 NOTE — ED Notes (Signed)
Patient called once for VS Assessment and EDP Assessment. No Response at this Time.

## 2021-03-12 NOTE — ED Notes (Signed)
Pt called twice and no reply. Looked outside and in other areas of waiting room. Informed CN.

## 2021-03-13 ENCOUNTER — Encounter (HOSPITAL_BASED_OUTPATIENT_CLINIC_OR_DEPARTMENT_OTHER): Payer: Self-pay

## 2021-03-13 ENCOUNTER — Emergency Department (HOSPITAL_BASED_OUTPATIENT_CLINIC_OR_DEPARTMENT_OTHER)
Admission: EM | Admit: 2021-03-13 | Discharge: 2021-03-13 | Disposition: A | Discharge status: Home / Self Care | Payer: BLUE CROSS/BLUE SHIELD | Source: Home / Self Care | Attending: Emergency Medicine | Admitting: Emergency Medicine

## 2021-03-13 ENCOUNTER — Emergency Department (HOSPITAL_BASED_OUTPATIENT_CLINIC_OR_DEPARTMENT_OTHER): Payer: BLUE CROSS/BLUE SHIELD | Admitting: Radiology

## 2021-03-13 DIAGNOSIS — J45909 Unspecified asthma, uncomplicated: Secondary | ICD-10-CM | POA: Insufficient documentation

## 2021-03-13 DIAGNOSIS — M7660 Achilles tendinitis, unspecified leg: Secondary | ICD-10-CM

## 2021-03-13 DIAGNOSIS — M25571 Pain in right ankle and joints of right foot: Secondary | ICD-10-CM | POA: Insufficient documentation

## 2021-03-13 MED ORDER — NAPROXEN 375 MG PO TABS: 375.0000 mg | ORAL_TABLET | Freq: Two times a day (BID) | ORAL | 0 refills | Status: AC

## 2021-03-13 NOTE — ED Notes (Signed)
Patient called Once more with No Response. Patient will be discharged accordingly.

## 2021-03-13 NOTE — Discharge Instructions (Addendum)
You may alternate taking Tylenol and Naproxen as needed for pain control. You may take Naproxen twice daily as directed on your discharge paperwork and you may take  500-1000 mg of Tylenol every 6 hours. Do not exceed 4000 mg of Tylenol daily as this can lead to liver damage. Also, make sure to take Naproxen with meals as it can cause an upset stomach. Do not take other NSAIDs while taking Naproxen such as (Aleve, Ibuprofen, Aspirin, Celebrex, etc) and do not take more than the prescribed dose as this can lead to ulcers and bleeding in your GI tract. You may use warm and cold compresses to help with your symptoms.   Please follow up with the orthopedic doctor within the next 7-10 days for re-evaluation and further treatment of your symptoms.   Please return to the ER sooner if you have any new or worsening symptoms.  

## 2021-03-13 NOTE — ED Provider Notes (Signed)
[facility] EMERGENCY DEPT Provider Note   CSN: [id number] Arrival date & time: 03/13/21  1427     History Chief Complaint  Patient presents with   Ankle Pain    Gina Cain is a 20 y.o. female.  HPI  20 year old female presents the emergency department today for evaluation of pain to the right Achilles tendon.  States she was walking a week ago and stepped on uneven ground when she hurt her Achilles tendon.  She has had pain ever since.  She is tried over-the-counter medications without relief.  She was having some improvement of her symptoms initially but after walking on it for a few days now her pain is worse.  It is constant and worse with weightbearing.  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    Complication of anesthesia    states woke up during surgery 12/2012    Patient Active Problem List   Diagnosis Date Noted   Acute pain of left knee 09/29/2017    Past Surgical History:  Procedure Laterality Date   GRAFT APPLICATION Left 11/18/2015   Procedure: PLACEMENT OF BONE GRAFT AND K WIRES FOURTH METATARSAL;  Surgeon: [name], [name];  Location: [facility];  Service: Podiatry;  Laterality: Left;   HARDWARE REMOVAL Left 11/18/2015   Procedure: REMOVAL OF FIXATION DEEP K WIRE/SCREW FOURTH METATARSAL LEFT FOOT;  Surgeon: [name], [name];  Location: [facility];  Service: Podiatry;  Laterality: Left;   METATARSAL OSTEOTOMY Right 01/02/2013   Procedure: FOURTH RIGHT METATARSAL OSTEOTOMY/APPLICATION OF EXTERNAL FIXATOR/SINGLE PLAIN WITH PINS;  Surgeon: Alvan Dame, [name];  Location: [facility];  Service: Podiatry;  Laterality: Right;   METATARSAL OSTEOTOMY Left 08/12/2015   Procedure: METATARSAL OSTEOTOMY WITH EXTERNAL FIXATOR 4TH LEFT TOE;  Surgeon: [name], [name];  Location: [facility];  Service: Podiatry;  Laterality: Left;     OB History    No obstetric history on file.     Family History  Problem Relation Age of Onset   Diabetes Mother    Asthma Brother    Hypertension Maternal Grandmother    Hypertension Maternal Grandfather     Social History   Tobacco Use   Smoking status: Never   Smokeless tobacco: Never  Vaping Use   Vaping Use: Never used  Substance Use Topics   Alcohol use: No   Drug use: No    Home Medications Prior to Admission medications   Medication Sig Start Date End Date Taking? Authorizing Provider  naproxen (NAPROSYN) 375 MG tablet Take 1 tablet (375 mg total) by mouth 2 (two) times daily with a meal for 7 days. 03/13/21 03/20/21 Yes Alashia Brownfield S, [name]  ALBUTEROL SULFATE IN Inhale into the lungs.    [provider]  ibuprofen (ADVIL) 800 MG tablet Take 1 tablet (800 mg total) by mouth every 8 (eight) hours as needed. 12/11/20   Terrilee Files, MD  methocarbamol (ROBAXIN) 500 MG tablet Take 1 tablet (500 mg total) by mouth 2 (two) times daily as needed for muscle spasms. 12/11/20   Terrilee Files, MD    Allergies    Sulfa antibiotics  Review of Systems   Review of Systems  Constitutional:  Negative for fever.  Musculoskeletal:        Right achilles tendon pain  Skin:  Negative for wound.   Physical Exam Updated Vital Signs BP 115/70 (BP Location: Right  Arm)   Pulse (!) 54   Temp 98.1 F (36.7 C) (Oral)   Resp 15   SpO2 100%   Physical Exam Vitals and nursing note reviewed.  Constitutional:      General: She is not in acute distress.    Appearance: She is well-developed.  HENT:     Head: Normocephalic and atraumatic.  Eyes:     Conjunctiva/sclera: Conjunctivae normal.  Cardiovascular:     Rate and Rhythm: Normal rate.  Pulmonary:     Effort: Pulmonary effort is normal.  Musculoskeletal:        General: Normal range of motion.     Cervical back: Neck supple.     Comments: TTP over the Achilles tendon.  Achilles tendon does appear intact.  Thompson's test  is negative.  No tenderness to the medial or lateral malleoli.  Skin:    General: Skin is warm and dry.  Neurological:     Mental Status: She is alert.    ED Results / Procedures / Treatments   Labs (all labs ordered are listed, but only abnormal results are displayed) Labs Reviewed - No data to display  EKG None  Radiology DG Ankle Complete Right  Result Date: 03/13/2021 CLINICAL DATA:  Ankle pain injury EXAM: RIGHT ANKLE - COMPLETE 3+ VIEW COMPARISON:  None. FINDINGS: There is no evidence of fracture, dislocation, or joint effusion. There is no evidence of arthropathy or other focal bone abnormality. Soft tissues are unremarkable. Partially visualized chronic deformity of fourth metatarsal. Metallic foreign body or surgical hardware at the fourth TMT region. IMPRESSION: No acute osseous abnormality Electronically Signed   By: Jasmine Pang M.D.   On: 03/13/2021 15:54    Procedures Procedures   Medications Ordered in ED Medications - No data to display  ED Course  I have reviewed the triage vital signs and the nursing notes.  Pertinent labs & imaging results that were available during my care of the patient were reviewed by me and considered in my medical decision making (see chart for details).    MDM Rules/Calculators/A&P                         20 year old female presenting for evaluation of pain to the right Achilles tendon after falling a few days ago.  X-ray of the ankle completed and does not show any acute abnormalities.  Achilles tendon appears to be intact on exam and Thompson's test is negative.  Have placed patient in a cam Olguin for comfort, given anti-inflammatories and given follow-up to orthopedics.  Have advised on return precautions.  He voiced understanding of the plan and reasons to return.  All questions answered.  Patient stable for discharge.   Final Clinical Impression(s) / ED Diagnoses Final diagnoses:  Pain in Achilles tendon    Rx / DC Orders ED  Discharge Orders          Ordered    naproxen (NAPROSYN) 375 MG tablet  2 times daily with meals        03/13/21 1723             Abdulkarim Eberlin S, [name] 03/13/21 1921    Vanetta Mulders, MD 03/17/21 1329

## 2021-03-13 NOTE — ED Triage Notes (Signed)
Pt arrives POV, with c/o right ankle pain.  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

## 2021-03-24 ENCOUNTER — Other Ambulatory Visit: Payer: Self-pay

## 2021-03-24 ENCOUNTER — Ambulatory Visit (INDEPENDENT_AMBULATORY_CARE_PROVIDER_SITE_OTHER): Payer: Self-pay | Admitting: Orthopaedic Surgery

## 2021-03-24 VITALS — Ht 65.0 in | Wt 155.0 lb

## 2021-03-24 DIAGNOSIS — M7661 Achilles tendinitis, right leg: Secondary | ICD-10-CM

## 2021-03-24 MED ORDER — MELOXICAM 15 MG PO TABS: 15.0000 mg | ORAL_TABLET | Freq: Every day | ORAL | 2 refills | Status: AC

## 2021-03-24 NOTE — Progress Notes (Signed)
Chief Complaint: right heal pain     History of Present Illness:   Pain Score: 0/10 SANE: 3/100  Gina Cain is a 20 y.o. 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  She initially felt a little heel pain and subsequently proceeded to the urgent care.  This time she was placed in a cam Trampe.  She has been taking naproxen which does not significantly help.  She has been in a cam boot which she believes is rubbing on the right heel and making it tender.  She has been out of work since this time.  She has a new baby at home with her wife.    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    Complication of anesthesia    states woke up during surgery 12/2012   Past Surgical History:  Procedure Laterality Date   GRAFT APPLICATION Left 11/18/2015   Procedure: PLACEMENT OF BONE GRAFT AND K WIRES FOURTH METATARSAL;  Surgeon: [name], [name];  Location: [facility];  Service: Podiatry;  Laterality: Left;   HARDWARE REMOVAL Left 11/18/2015   Procedure: REMOVAL OF FIXATION DEEP K WIRE/SCREW FOURTH METATARSAL LEFT FOOT;  Surgeon: [name], [name];  Location: [facility];  Service: Podiatry;  Laterality: Left;   METATARSAL OSTEOTOMY Right 01/02/2013   Procedure: FOURTH RIGHT METATARSAL OSTEOTOMY/APPLICATION OF EXTERNAL FIXATOR/SINGLE PLAIN WITH PINS;  Surgeon: Alvan Dame, [name];  Location: [facility];  Service: Podiatry;  Laterality: Right;   METATARSAL OSTEOTOMY Left 08/12/2015   Procedure: METATARSAL OSTEOTOMY WITH EXTERNAL FIXATOR 4TH LEFT TOE;  Surgeon: [name], [name];  [facility];  Service: Podiatry;  Laterality: Left;   Social History   Socioeconomic History   Marital  status: Single    Spouse name: Not on file   Number of children: Not on file   Years of education: Not on file   Highest education level: Not on file  Occupational History   Not on file  Tobacco Use   Smoking status: Never   Smokeless tobacco: Never  Vaping Use   Vaping Use: Never used  Substance and Sexual Activity   Alcohol use: No   Drug use: No   Sexual activity: Not on file  Other Topics Concern   Not on file  Social History Narrative   Not on file   Social Determinants of Health   Financial Resource Strain: Not on file  Food Insecurity: Not on file  Transportation Needs: Not on file  Physical Activity: Not on file  Stress: Not on file  Social Connections: Not on file   Family History  Problem Relation Age of Onset   Diabetes Mother    Asthma Brother    Hypertension Maternal Grandmother    Hypertension Maternal Grandfather    Allergies  Allergen Reactions   Sulfa Antibiotics Hives   Current Outpatient Medications  Medication Sig Dispense Refill   ALBUTEROL SULFATE IN Inhale into the lungs.     ibuprofen (ADVIL) 800 MG tablet Take 1 tablet (800 mg total) by mouth every 8 (eight) hours as needed. 21  tablet 0   methocarbamol (ROBAXIN) 500 MG tablet Take 1 tablet (500 mg total) by mouth 2 (two) times daily as needed for muscle spasms. 20 tablet 0   No current facility-administered medications for this visit.   No results found.  Review of Systems:   A ROS was performed including pertinent positives and negatives as documented in the HPI.  Physical Exam :   Constitutional: NAD and appears stated age Neurological: Alert and oriented Psych: Appropriate affect and cooperative Height 5\' 5"  (1.651 m), weight 155 lb (70.3 kg).   Comprehensive Musculoskeletal Exam:     Right Left  Gait Antalgic  Musculoskeletal Exam    Deformity No deformity No deformity  Tenderness Achilles midsubstance None  Skin    Abrasions None None  Blisters None None  Range of  Motion    Ankle Dorsiflexion 15 with pain 25  Ankle Plantarflexion 45 45  Subtalar Joint Inversion/Eversion Normal Normal  Stability    Dislocations None None  Subluxations or Laxity None None  Muscle Strength    Single Heel Raise Able Able  Sensation    Sural Nerve Dist. Normal Normal  Saphenous Nerve Dist. Normal Normal  Tibial Nerve Dist. Normal Normal  Deep Peroneal Nerve Dist. Normal Normal  Superficial Peroneal Nerve Dist. Normal Normal  Cardiovascular     Varicosites None  None  DP Artery Pulse Palpable Palpable  Capillary Refill <2 sec <2 sec  Special Tests:   Supine Thompson test reveals intact Achilles bilateral     Imaging:   Xray (3 views right ankle): Normal   I personally reviewed and interpreted the radiographs.   Assessment:   20 year old female with right Achilles strain after slipping and tripping on a concrete stump while at work on March 14, 2021.  At this time I advised that she get out of her cam boot and into a normal shoe with a heel lift.  This will allow her to take tension off the Achilles while it heals.  I have shown him the exact heel lifts that I would like, half inch.  I also provided her with a work note as well.  Should not be expected to work at this time.  I will see her back in 2 weeks for reassessment  Plan :    -Plan for-she will lift in normal shoe -Return to clinic in 2 weeks -Mobic prescription provided, she will take this instead of naproxen    I personally saw and evaluated the patient, and participated in the management and treatment plan.  March 16, 2021, MD Attending Physician, Orthopedic Surgery  This document was dictated using Dragon voice recognition software. A reasonable attempt at proof reading has been made to minimize errors.

## 2021-04-07 ENCOUNTER — Ambulatory Visit (HOSPITAL_BASED_OUTPATIENT_CLINIC_OR_DEPARTMENT_OTHER): Payer: BLUE CROSS/BLUE SHIELD | Admitting: Orthopaedic Surgery

## 2021-04-18 ENCOUNTER — Ambulatory Visit (HOSPITAL_BASED_OUTPATIENT_CLINIC_OR_DEPARTMENT_OTHER): Payer: BLUE CROSS/BLUE SHIELD | Admitting: Orthopaedic Surgery

## 2021-04-22 ENCOUNTER — Ambulatory Visit (HOSPITAL_BASED_OUTPATIENT_CLINIC_OR_DEPARTMENT_OTHER)
Admission: RE | Admit: 2021-04-22 | Discharge: 2021-04-22 | Disposition: A | Discharge status: Home / Self Care | Payer: BLUE CROSS/BLUE SHIELD | Source: Ambulatory Visit | Attending: Orthopaedic Surgery | Admitting: Orthopaedic Surgery

## 2021-04-22 ENCOUNTER — Ambulatory Visit (INDEPENDENT_AMBULATORY_CARE_PROVIDER_SITE_OTHER): Payer: BLUE CROSS/BLUE SHIELD | Admitting: Orthopaedic Surgery

## 2021-04-22 ENCOUNTER — Other Ambulatory Visit: Payer: Self-pay

## 2021-04-22 ENCOUNTER — Other Ambulatory Visit (HOSPITAL_BASED_OUTPATIENT_CLINIC_OR_DEPARTMENT_OTHER): Payer: Self-pay | Admitting: Orthopaedic Surgery

## 2021-04-22 DIAGNOSIS — M25511 Pain in right shoulder: Secondary | ICD-10-CM | POA: Diagnosis present

## 2021-04-22 DIAGNOSIS — S43431A Superior glenoid labrum lesion of right shoulder, initial encounter: Secondary | ICD-10-CM | POA: Diagnosis not present

## 2021-04-22 DIAGNOSIS — G8929 Other chronic pain: Secondary | ICD-10-CM | POA: Diagnosis present

## 2021-04-22 NOTE — Progress Notes (Addendum)
Chief Complaint: right heal pain, right shoulder pain     History of Present Illness:   04/22/2021: Presents today for follow-up of her right heel and right shoulder.  Overall the heel feels much better.  She was not able to tolerate the heel lift as this causes numbness in the toe.  She states that the heel is only painful with occasional twisting movements.  Says that the right shoulder has been flaring up more recently over the past week.  Of note she does have a history of right shoulder pain for multiple years at this time.  She has seen multiple orthopedic doctors who recommended physical therapy which she had complete was completed for multiple months.  Overall this does not provide any significant relief.  She has been taking ibuprofen which does not help.  She has tried sling and activity modification in the past which also does not help.  She has a young child at home named Marga Gramajo is a 20 y.o. 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  She initially felt a little heel pain and subsequently proceeded to the urgent care.  This time she was placed in a cam Morin.  She has been taking naproxen which does not significantly help.  She has been in a cam boot which she believes is rubbing on the right heel and making it tender.  She has been out of work since this time.  She has a new baby at home with her wife.    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    Complication of anesthesia    states woke up during surgery 12/2012   Past Surgical History:  Procedure Laterality Date   GRAFT APPLICATION Left 11/18/2015   Procedure: PLACEMENT OF BONE GRAFT AND K WIRES FOURTH METATARSAL;  Surgeon: [name], [name];  Location: [facility];  Service: Podiatry;  Laterality: Left;   HARDWARE REMOVAL Left 11/18/2015   Procedure: REMOVAL OF FIXATION DEEP K WIRE/SCREW FOURTH METATARSAL LEFT FOOT;  Surgeon: [name], [name];  Location: [facility];  Service: Podiatry;  Laterality: Left;   METATARSAL OSTEOTOMY Right 01/02/2013   Procedure: FOURTH RIGHT METATARSAL OSTEOTOMY/APPLICATION OF EXTERNAL FIXATOR/SINGLE PLAIN WITH PINS;  Surgeon: Alvan Dame, [name];  Location: [facility];  Service: Podiatry;  Laterality: Right;   METATARSAL OSTEOTOMY Left 08/12/2015   Procedure: METATARSAL OSTEOTOMY WITH EXTERNAL FIXATOR 4TH LEFT TOE;  Surgeon: [name], [name];  Location: [facility];  Service: Podiatry;  Laterality: Left;   Social History   Socioeconomic History   Marital status: Single    Spouse name: Not on file   Number of children: Not on file   Years of education: Not on file   Highest education level: Not on file  Occupational History   Not on file  Tobacco Use   Smoking status: Never   Smokeless tobacco: Never  Vaping Use   Vaping Use: Never used  Substance and Sexual Activity   Alcohol use: No   Drug use: No   Sexual activity: Not on file  Other Topics Concern   Not on file  Social History Narrative   Not on file   Social Determinants of Health   Financial Resource Strain: Not on file  Food Insecurity: Not on file  Transportation Needs: Not on file  Physical Activity: Not on file  Stress: Not on file  Social Connections: Not on file   Family History  Problem Relation Age of Onset   Diabetes Mother    Asthma Brother    Hypertension Maternal Grandmother    Hypertension Maternal Grandfather    Allergies  Allergen Reactions   Sulfa Antibiotics Hives   Current Outpatient Medications  Medication Sig Dispense Refill   ALBUTEROL SULFATE IN Inhale into the lungs.     ibuprofen (ADVIL) 800 MG tablet Take 1 tablet (800 mg total) by mouth every 8  (eight) hours as needed. 21 tablet 0   meloxicam (MOBIC) 15 MG tablet Take 1 tablet (15 mg total) by mouth daily. 30 tablet 2   methocarbamol (ROBAXIN) 500 MG tablet Take 1 tablet (500 mg total) by mouth 2 (two) times daily as needed for muscle spasms. 20 tablet 0   No current facility-administered medications for this visit.   No results found.  Review of Systems:   A ROS was performed including pertinent positives and negatives as documented in the HPI.  Physical Exam :   Constitutional: NAD and appears stated age Neurological: Alert and oriented Psych: Appropriate affect and cooperative There were no vitals taken for this visit.   Comprehensive Musculoskeletal Exam:    Musculoskeletal Exam    Inspection Right Left  Skin No atrophy or winging No atrophy or winging  Palpation    Tenderness Glenohumeral None  Range of Motion    Flexion (passive) 170 170  Flexion (active) 170 170  Abduction 170 170  ER at the side 70 70  Can reach behind back to T12 T12  Strength     Full with pain Full  Special Tests    Pseudoparalytic No No  Neurologic    Fires PIN, radial, median, ulnar, musculocutaneous, axillary, suprascapular, long thoracic, and spinal accessory innervated muscles. No abnormal sensibility  Vascular/Lymphatic    Radial Pulse 2+ 2+  Cervical Exam    Patient has symmetric cervical range of motion with negative Spurling's test.  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 Otherwise normal.   I personally reviewed and interpreted the radiographs.   Assessment:   20 year old female with right Achilles strain which is now improved.  I recommending some gentle stretches for the Achilles at this time.  This should continue to return to completely normal over the next couple weeks.  With regard to her right shoulder I  do believe her exam and history are consistent with a labral injury.  She denies any frank instability.  She has trialed extensive nonoperative therapy including multiple months of physical therapy which has not given her significant relief.  She has failed NSAIDs and activity modification.  I would like to obtain an MRI with gadolinium to assess for a labral injury. Plan :    -Plan for MRI right shoulder with contrast -Return to clinic in 3 weeks for recheck and to discuss results of the MRI    I personally saw and evaluated the patient, and participated in the management and treatment plan.  Viviann Spare  Sammuel Hines, MD Attending Physician, Orthopedic Surgery  This document was dictated using Dragon voice recognition software. A reasonable attempt at proof reading has been made to minimize errors.

## 2021-04-30 ENCOUNTER — Telehealth: Payer: Self-pay | Admitting: Orthopedic Surgery

## 2021-04-30 NOTE — Telephone Encounter (Signed)
I called [facility] -- they have not been able to contact the patient. This number that the patient was calling from today is a different number than what we have in the chart. I did call Gina Cain and check if this is where she can be reached -- she confirmed. Marylene Land, with [facility] said they will try the patient again at this phone number.

## 2021-04-30 NOTE — Telephone Encounter (Signed)
[name] working on this

## 2021-04-30 NOTE — Telephone Encounter (Signed)
Gina Cain is checking on the status of her MRI being scheduled.  She can be reached at [phone].

## 2021-05-13 ENCOUNTER — Ambulatory Visit (HOSPITAL_BASED_OUTPATIENT_CLINIC_OR_DEPARTMENT_OTHER): Payer: BLUE CROSS/BLUE SHIELD | Admitting: Orthopaedic Surgery

## 2021-06-19 ENCOUNTER — Telehealth: Payer: Self-pay | Admitting: Orthopaedic Surgery

## 2021-06-19 NOTE — Telephone Encounter (Signed)
Received medical records release form from patient  

## 2022-06-16 ENCOUNTER — Telehealth: Payer: Self-pay

## 2022-06-16 NOTE — Telephone Encounter (Signed)
Sending mychart msg. As, CMA

## 2022-06-30 IMAGING — DX DG ANKLE COMPLETE 3+V*R*
3 series · 3 of 3 positions shown · non-contrast
Comparison: None.

CLINICAL DATA: Ankle pain injury

EXAM:
RIGHT ANKLE - COMPLETE 3+ VIEW

[ankle ap]
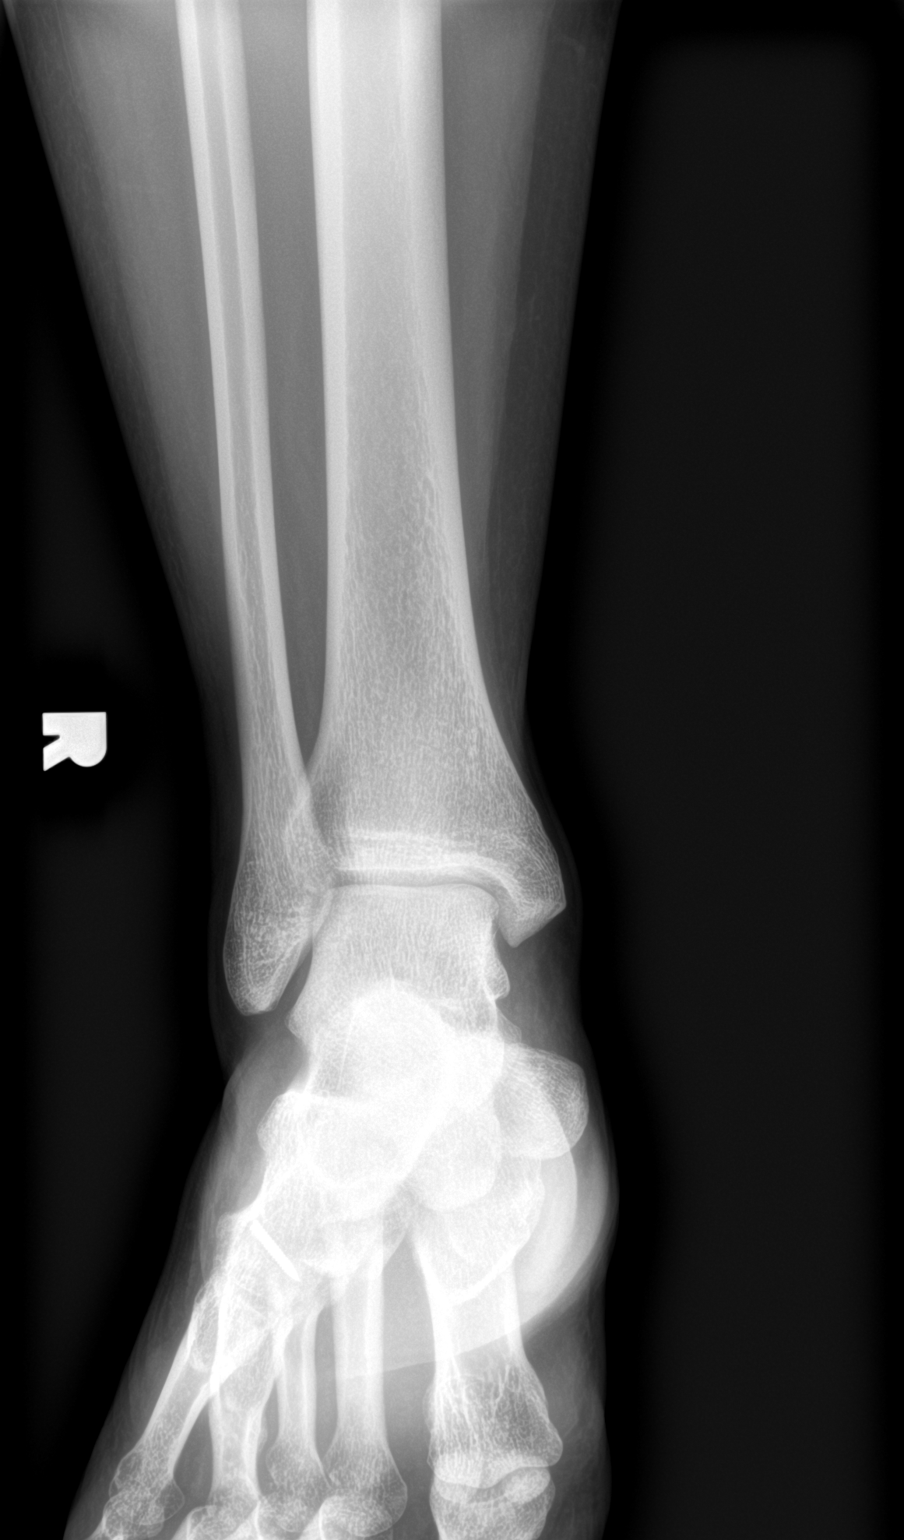

[ankle obl]
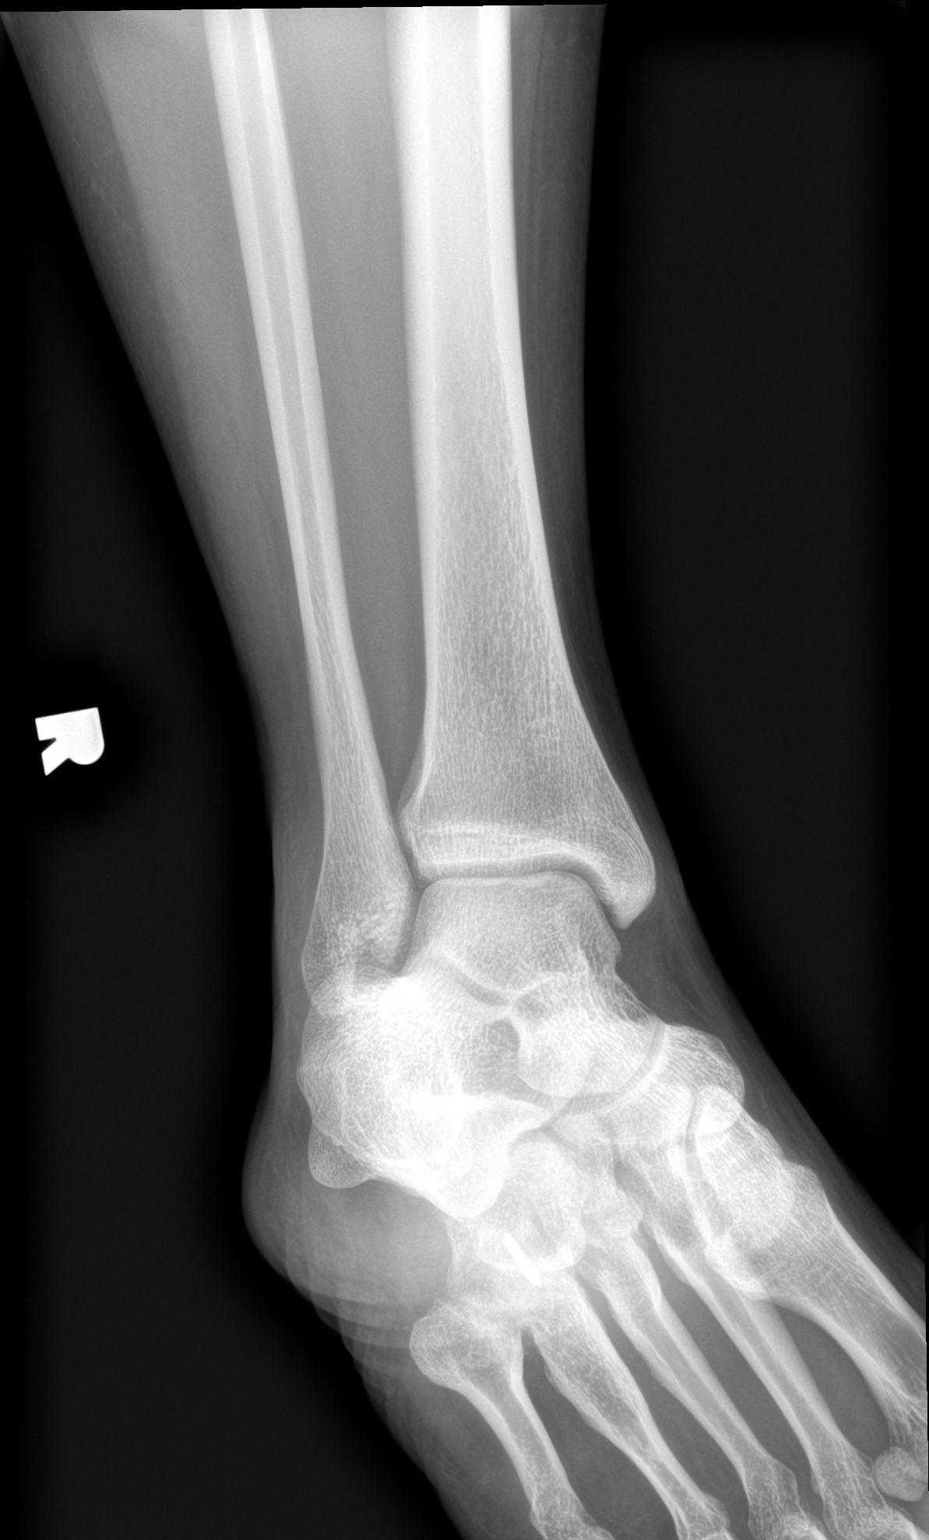

[ankle lat]
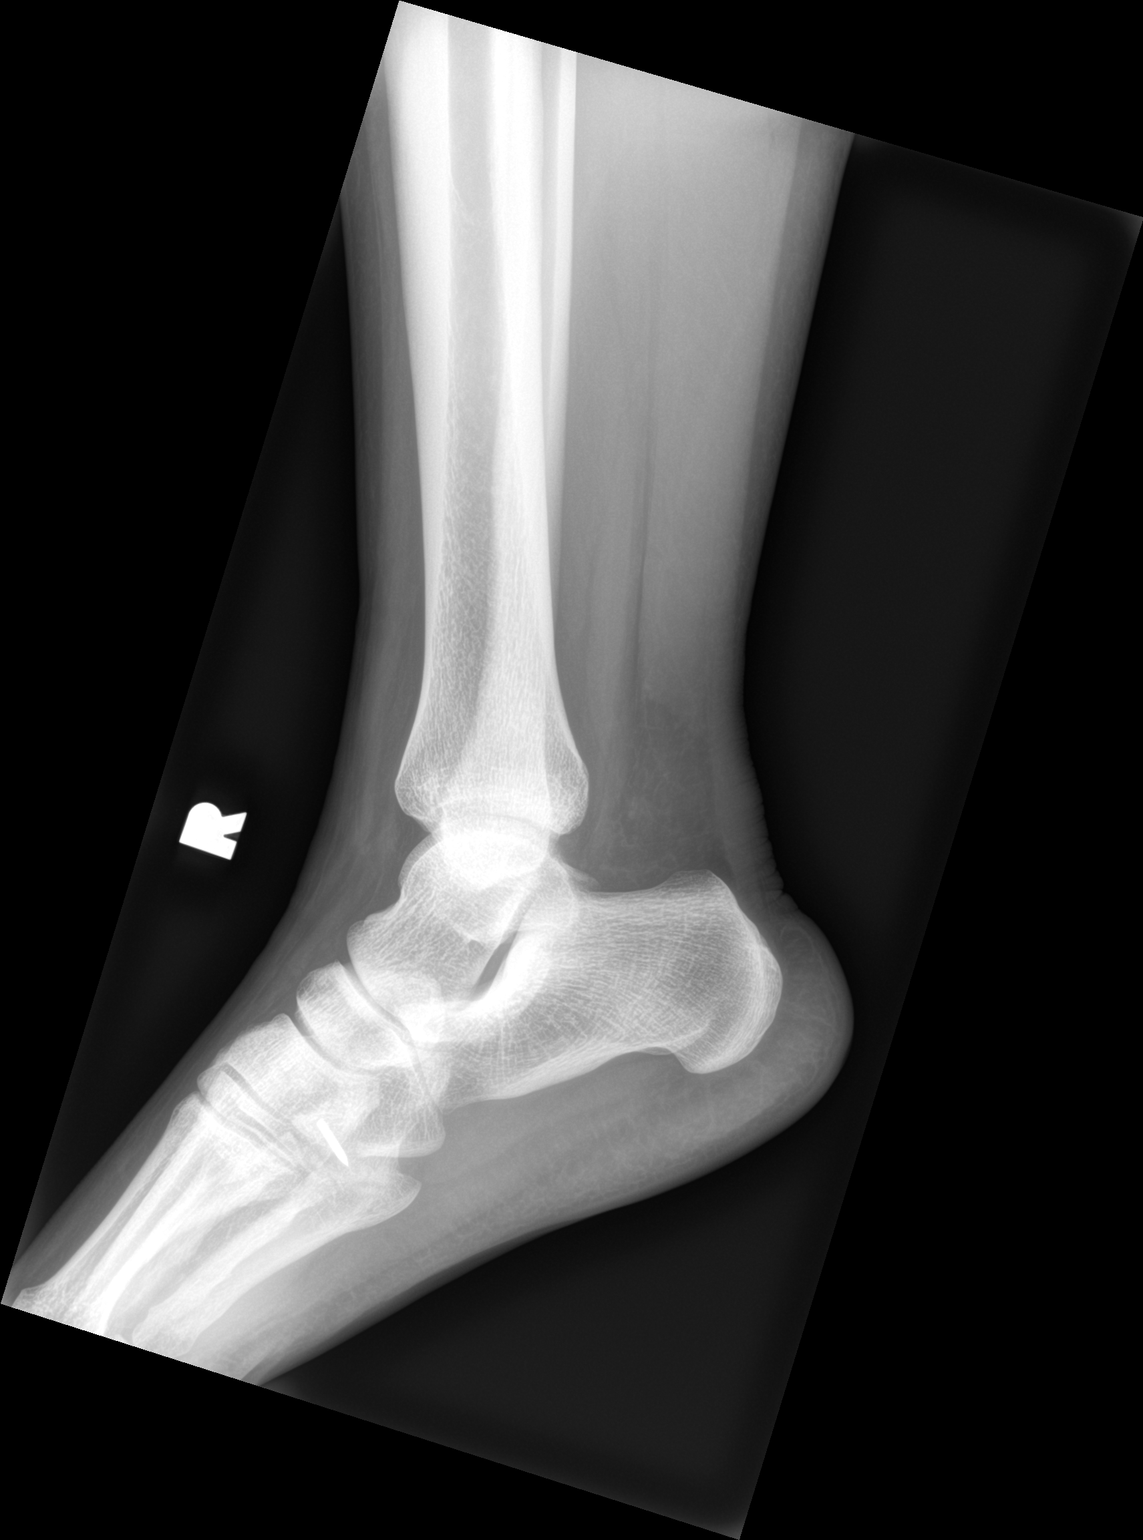

[3 of 3 positions shown; findings below may reference images not displayed]

FINDINGS: There is no evidence of fracture, dislocation, or joint effusion.
There is no evidence of arthropathy or other focal bone abnormality.
Soft tissues are unremarkable. Partially visualized chronic
deformity of fourth metatarsal. Metallic foreign body or surgical
hardware at the fourth TMT region.
IMPRESSION: No acute osseous abnormality
